# Patient Record
Sex: Male | Born: 1950 | Race: White | Hispanic: No | Marital: Married | Smoking: Never smoker
Health system: Southern US, Community
[De-identification: ages and names within clinical notes are randomized; demographics above are authoritative.]

## PROBLEM LIST (undated history)

## (undated) DIAGNOSIS — E785 Hyperlipidemia, unspecified: Secondary | ICD-10-CM

## (undated) HISTORY — PX: NO PAST SURGERIES: SHX2092

---

## 2015-04-01 DIAGNOSIS — M222X9 Patellofemoral disorders, unspecified knee: Secondary | ICD-10-CM | POA: Insufficient documentation

## 2015-04-01 DIAGNOSIS — M25569 Pain in unspecified knee: Secondary | ICD-10-CM | POA: Insufficient documentation

## 2015-08-23 ENCOUNTER — Ambulatory Visit (INDEPENDENT_AMBULATORY_CARE_PROVIDER_SITE_OTHER): Payer: BLUE CROSS/BLUE SHIELD

## 2015-08-23 ENCOUNTER — Telehealth: Payer: Self-pay | Admitting: *Deleted

## 2015-08-23 ENCOUNTER — Ambulatory Visit (INDEPENDENT_AMBULATORY_CARE_PROVIDER_SITE_OTHER): Payer: BLUE CROSS/BLUE SHIELD | Admitting: Podiatry

## 2015-08-23 ENCOUNTER — Encounter: Payer: Self-pay | Admitting: Podiatry

## 2015-08-23 VITALS — BP 119/77 | HR 89 | Resp 16 | Ht 69.0 in | Wt 205.0 lb

## 2015-08-23 DIAGNOSIS — M7661 Achilles tendinitis, right leg: Secondary | ICD-10-CM | POA: Diagnosis not present

## 2015-08-23 DIAGNOSIS — M79671 Pain in right foot: Secondary | ICD-10-CM

## 2015-08-23 MED ORDER — NITROGLYCERIN 0.2 MG/HR TD PT24: 0.2000 mg | MEDICATED_PATCH | Freq: Every day | TRANSDERMAL | Status: AC

## 2015-08-23 MED ORDER — METHYLPREDNISOLONE 4 MG PO TBPK: ORAL_TABLET | ORAL | Status: DC

## 2015-08-23 NOTE — Patient Instructions (Signed)

## 2015-08-23 NOTE — Progress Notes (Signed)
   Subjective:    Patient ID: Richard Clark, male    DOB: 03/30/1951, 65 y.o.   MRN: [id number]  HPI: He presents today with a chief complaint of pain to the right posterior lateral aspect of his heel. He states that about a year ago he tripped over the age of his patio nearly falling. He states from that day on he's had pain to the posterior aspect of his right heel. He states that some days are worse than others. He is an avid golfer and this is stopping him from enjoying his golf regimen. He is really tried nothing for it.    Review of Systems  Musculoskeletal: Positive for gait problem.  All other systems reviewed and are negative.      Objective:   Physical Exam: I have reviewed his past medical history medications allergy surgery social history review of systems. Pulses are strongly palpable. Neurologic sensorium is intact per Semmes-Weinstein monofilament. Deep tendon reflexes are intact bilateral and muscle strength is normal. He has tenderness on palpation of the Achilles and distal lateral aspect of the right heel. There is no swelling overlying the area. Radiographs do confirm what appears to be a fractured spur with some soft tissue swelling of the Achilles at its insertion site on the right heel. Radiographically other findings include early osteoarthritic changes first metatarsophalangeal joint with dorsal spurring and an elevated first metatarsal.          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  Plan: I started him on a Medrol Dosepak to be followed by meloxicam. Injected the area with dexamethasone and local anesthetic making sure not to inject into the tendon itself. I also recommended that he utilize a night which she does have a period also recommended icing and appropriate shoe gear. Should this not alleviate his symptoms prior to next visit we will consider an MRI to rule out any tear resident.

## 2015-08-23 NOTE — Telephone Encounter (Signed)
Kayla - [facility] states there may be a reaction between pt's Nitroglycerin patch and Viagra.  Dr. Al CorpusHyatt states pt is only on 0.2mg  Nitroglycerin on his right achilles tendon there should be no problem.  I left message with Dr. Geryl RankinsHyatt's [facility] [place] [facility]

## 2015-09-21 ENCOUNTER — Ambulatory Visit (INDEPENDENT_AMBULATORY_CARE_PROVIDER_SITE_OTHER): Payer: BLUE CROSS/BLUE SHIELD | Admitting: Podiatry

## 2015-09-21 ENCOUNTER — Encounter: Payer: Self-pay | Admitting: Podiatry

## 2015-09-21 VITALS — BP 121/79 | HR 91 | Resp 16

## 2015-09-21 DIAGNOSIS — M7661 Achilles tendinitis, right leg: Secondary | ICD-10-CM

## 2015-09-21 DIAGNOSIS — S86011A Strain of right Achilles tendon, initial encounter: Secondary | ICD-10-CM | POA: Diagnosis not present

## 2015-09-21 NOTE — Progress Notes (Signed)
He presents today for follow-up of his Achilles tendinitis. He states that this really is not getting any better as he refers to the distal medial aspect of the Achilles right foot. He states that even with the medication he really didn't get any better and he continues to play golf on a regular basis. He states that it bothers him to walk the golf course and it bothers him after he's been sitting for a while and gets back up to ambulate.  Objective: Vital signs are stable he is alert and oriented 3 conservative therapies Currently failed with pain on palpation to the posterior aspect of the Achilles as it inserts on the right heel. He has no other palpable pain. There is mild erythema and mild edema in this area.  Assessment: Insertional Achilles tendinitis right heel.  Plan: At this point due to failure of all conservative therapies and MRI is going to be requested for the right ankle. I will follow-up with him once that comes in.

## 2015-09-22 ENCOUNTER — Telehealth: Payer: Self-pay | Admitting: *Deleted

## 2015-09-22 NOTE — Telephone Encounter (Addendum)
Orders given to [employer] [facility] for [employer]. 09/23/2015-[employer] PRIOR AUTHORIZATION# [id number], VALID 09/23/2015 - 10/22/2015. [facility].  10/10/2015-Left message informing pt, Dr. Al CorpusHyatt had reviewed the MRI results and was sending for a more in depth reading, there would be a delay, once results were reviewed I would call with instructions.  Pt called for clarification of reason for the over read.  I called pt and explained that in some cases, the doctor was wanting a finer detailed reading of the results or the symptoms presented by the pt and x-rays and the MRI were not as expected.  Pt states understanding and was happy with the explanations and callback. 10/12/2015-Mailed copy of MRI disc to [facility].

## 2015-10-07 ENCOUNTER — Ambulatory Visit
Admission: RE | Admit: 2015-10-07 | Discharge: 2015-10-07 | Disposition: A | Discharge status: Home / Self Care | Payer: BLUE CROSS/BLUE SHIELD | Source: Ambulatory Visit | Attending: Podiatry | Admitting: Podiatry

## 2015-10-07 ENCOUNTER — Telehealth: Payer: Self-pay | Admitting: *Deleted

## 2015-10-07 DIAGNOSIS — S86011A Strain of right Achilles tendon, initial encounter: Secondary | ICD-10-CM | POA: Insufficient documentation

## 2015-10-07 NOTE — Telephone Encounter (Signed)
Patient called - said had MRI of ankle this morning and wanted to know if he had read the results yet.

## 2015-10-10 NOTE — Telephone Encounter (Signed)
-----   Message from Elinor ParkinsonMax T Hyatt, [place] sent at 10/10/2015  7:39 AM EDT ----- Please send for an over read and inform patient of the delay.

## 2015-10-17 ENCOUNTER — Telehealth: Payer: Self-pay | Admitting: *Deleted

## 2015-10-17 NOTE — Telephone Encounter (Signed)
Patient called regarding MRI results. "This is getting ridiculous"  I was just given the results from his over read today by Dr. Al CorpusHyatt and he is requesting for the pt to come in for an appt.

## 2015-10-19 ENCOUNTER — Ambulatory Visit (INDEPENDENT_AMBULATORY_CARE_PROVIDER_SITE_OTHER): Payer: BLUE CROSS/BLUE SHIELD | Admitting: Podiatry

## 2015-10-19 ENCOUNTER — Encounter: Payer: Self-pay | Admitting: Podiatry

## 2015-10-19 VITALS — BP 130/79 | HR 89 | Resp 18

## 2015-10-19 DIAGNOSIS — M7661 Achilles tendinitis, right leg: Secondary | ICD-10-CM | POA: Diagnosis not present

## 2015-10-19 NOTE — Progress Notes (Signed)
Peyton NajjarLarry presents today for follow-up of his Achilles tendinitis of his heel. He states it is not improved at this point.  Objective: MRI does state that there is a retrocalcaneal heel spur that has fragmented and there is tendinopathy associated with the Achilles.  Assessment: No macro tear of the Achilles tendon with tendinitis and tendinopathy and a retrocalcaneal spur.  Plan: At this point were willing to try physical therapy if this does not work then surgery will be necessary.

## 2017-07-02 ENCOUNTER — Ambulatory Visit
Admission: RE | Admit: 2017-07-02 | Discharge: 2017-07-02 | Disposition: A | Discharge status: Home / Self Care | Payer: Medicare Other | Source: Ambulatory Visit | Attending: Gastroenterology | Admitting: Gastroenterology

## 2017-07-02 ENCOUNTER — Ambulatory Visit: Payer: Medicare Other | Admitting: Anesthesiology

## 2017-07-02 ENCOUNTER — Encounter
Admission: RE | Disposition: A | Discharge status: Home / Self Care | Payer: Self-pay | Source: Ambulatory Visit | Attending: Gastroenterology

## 2017-07-02 ENCOUNTER — Encounter: Payer: Self-pay | Admitting: Certified Registered Nurse Anesthetist

## 2017-07-02 DIAGNOSIS — K2210 Ulcer of esophagus without bleeding: Secondary | ICD-10-CM | POA: Diagnosis not present

## 2017-07-02 DIAGNOSIS — K210 Gastro-esophageal reflux disease with esophagitis: Secondary | ICD-10-CM | POA: Diagnosis not present

## 2017-07-02 DIAGNOSIS — Z79899 Other long term (current) drug therapy: Secondary | ICD-10-CM | POA: Insufficient documentation

## 2017-07-02 DIAGNOSIS — E785 Hyperlipidemia, unspecified: Secondary | ICD-10-CM | POA: Diagnosis not present

## 2017-07-02 DIAGNOSIS — K2950 Unspecified chronic gastritis without bleeding: Secondary | ICD-10-CM | POA: Insufficient documentation

## 2017-07-02 DIAGNOSIS — K219 Gastro-esophageal reflux disease without esophagitis: Secondary | ICD-10-CM | POA: Diagnosis present

## 2017-07-02 HISTORY — DX: Hyperlipidemia, unspecified: E78.5

## 2017-07-02 HISTORY — PX: ESOPHAGOGASTRODUODENOSCOPY (EGD) WITH PROPOFOL: SHX5813

## 2017-07-02 SURGERY — ESOPHAGOGASTRODUODENOSCOPY (EGD) WITH PROPOFOL
Anesthesia: General

## 2017-07-02 MED ORDER — LIDOCAINE HCL (PF) 1 % IJ SOLN
INTRAMUSCULAR | Status: AC
  Administered 2017-07-02: 0.3 mL via INTRADERMAL
  Filled 2017-07-02: qty 2

## 2017-07-02 MED ORDER — PROPOFOL 10 MG/ML IV BOLUS
INTRAVENOUS | Status: DC | PRN
  Administered 2017-07-02: 90 mg via INTRAVENOUS
  Administered 2017-07-02: 30 mg via INTRAVENOUS

## 2017-07-02 MED ORDER — LIDOCAINE HCL (PF) 2 % IJ SOLN
INTRAMUSCULAR | Status: AC
  Filled 2017-07-02: qty 10

## 2017-07-02 MED ORDER — LIDOCAINE HCL (CARDIAC) 20 MG/ML IV SOLN
INTRAVENOUS | Status: DC | PRN
  Administered 2017-07-02: 50 mg via INTRAVENOUS

## 2017-07-02 MED ORDER — PROPOFOL 500 MG/50ML IV EMUL
INTRAVENOUS | Status: AC
  Filled 2017-07-02: qty 50

## 2017-07-02 MED ORDER — SODIUM CHLORIDE 0.9 % IV SOLN
INTRAVENOUS | Status: DC
  Administered 2017-07-02: 1000 mL via INTRAVENOUS

## 2017-07-02 MED ORDER — PROPOFOL 500 MG/50ML IV EMUL
INTRAVENOUS | Status: DC | PRN
  Administered 2017-07-02: 140 ug/kg/min via INTRAVENOUS

## 2017-07-02 MED ORDER — LIDOCAINE HCL (PF) 1 % IJ SOLN
2.0000 mL | Freq: Once | INTRAMUSCULAR | Status: AC
  Administered 2017-07-02: 0.3 mL via INTRADERMAL

## 2017-07-02 NOTE — Anesthesia Postprocedure Evaluation (Signed)
Anesthesia Post Note  Patient: Gary FleetLarry Crumpler  Procedure(s) Performed: ESOPHAGOGASTRODUODENOSCOPY (EGD) WITH PROPOFOL (N/A )  Patient location during evaluation: Endoscopy Anesthesia Type: General Level of consciousness: awake and alert and oriented Pain management: pain level controlled Vital Signs Assessment: post-procedure vital signs reviewed and stable Respiratory status: spontaneous breathing, nonlabored ventilation and respiratory function stable Cardiovascular status: blood pressure returned to baseline and stable Postop Assessment: no signs of nausea or vomiting Anesthetic complications: no     Last Vitals:  Vitals:   07/02/17 1100 07/02/17 1120  BP: (!) 134/96 117/88  Pulse: 78 64  Resp: 14 10  Temp:    SpO2: 100% 100%    Last Pain: There were no vitals filed for this visit.               Jaelyn Cloninger

## 2017-07-02 NOTE — Anesthesia Preprocedure Evaluation (Signed)
Anesthesia Evaluation  Patient identified by MRN, date of birth, ID band Patient awake    Reviewed: Allergy & Precautions, NPO status , Patient's Chart, lab work & pertinent test results  History of Anesthesia Complications Negative for: history of anesthetic complications  Airway Mallampati: II  TM Distance: >3 FB Neck ROM: Full    Dental no notable dental hx.    Pulmonary neg pulmonary ROS, neg sleep apnea, neg COPD,    breath sounds clear to auscultation- rhonchi (-) wheezing      Cardiovascular Exercise Tolerance: Good (-) hypertension(-) CAD and (-) Past MI  Rhythm:Regular Rate:Normal - Systolic murmurs and - Diastolic murmurs    Neuro/Psych negative neurological ROS  negative psych ROS   GI/Hepatic negative GI ROS, Neg liver ROS,   Endo/Other  negative endocrine ROSneg diabetes  Renal/GU negative Renal ROS     Musculoskeletal negative musculoskeletal ROS (+)   Abdominal (+) + obese,   Peds  Hematology negative hematology ROS (+)   Anesthesia Other Findings Past Medical History: No date: Hyperlipidemia   Reproductive/Obstetrics                             Anesthesia Physical Anesthesia Plan  ASA: II  Anesthesia Plan: General   Post-op Pain Management:    Induction: Intravenous  PONV Risk Score and Plan: 1 and Propofol infusion  Airway Management Planned: Natural Airway  Additional Equipment:   Intra-op Plan:   Post-operative Plan:   Informed Consent: I have reviewed the patients History and Physical, chart, labs and discussed the procedure including the risks, benefits and alternatives for the proposed anesthesia with the patient or authorized representative who has indicated his/her understanding and acceptance.   Dental advisory given  Plan Discussed with: CRNA and Anesthesiologist  Anesthesia Plan Comments:         Anesthesia Quick Evaluation

## 2017-07-02 NOTE — Transfer of Care (Signed)
Immediate Anesthesia Transfer of Care Note  Patient: Richard FleetLarry Kozicki  Procedure(s) Performed: ESOPHAGOGASTRODUODENOSCOPY (EGD) WITH PROPOFOL (N/A )  Patient Location: PACU and Endoscopy Unit  Anesthesia Type:General  Level of Consciousness: drowsy  Airway & Oxygen Therapy: Patient Spontanous Breathing and Patient connected to nasal cannula oxygen  Post-op Assessment: Report given to RN and Post -op Vital signs reviewed and stable  Post vital signs: Reviewed and stable  Last Vitals:  Vitals:   07/02/17 1000  BP: 118/85  Pulse: 85  Resp: 17  Temp: (!) 36.1 C  SpO2: 100%    Last Pain: There were no vitals filed for this visit.       Complications: No apparent anesthesia complications

## 2017-07-02 NOTE — Op Note (Signed)
[facility] Gastroenterology Patient Name: Gary FleetLarry Erxleben Procedure Date: 07/02/2017 10:28 AM MRN: [id number] Account #: [id number] Date of Birth: 08/27/1950 Admit Type: Outpatient Age: 67 [facility] 3 Gender: Male Note Status: Finalized Procedure:            Upper GI endoscopy Indications:          Gastro-esophageal reflux disease Providers:            Christena DeemMartin U. Skulskie, MD Referring MD:         Lysle Rubensaniel D. Crummett NP, NP (Referring MD) Medicines:            Monitored Anesthesia Care Complications:        No immediate complications. Procedure:            Pre-Anesthesia Assessment:                       - ASA Grade Assessment: II - A patient with mild                        systemic disease.                       After obtaining informed consent, the endoscope was                        passed under direct vision. Throughout the procedure,                        the patient's blood pressure, pulse, and oxygen                        saturations were monitored continuously. The Endoscope                        was introduced through the mouth, and advanced to the                        third part of duodenum. The upper GI endoscopy was                        accomplished without difficulty. The patient tolerated                        the procedure well. Findings:      The Z-line was variable.      LA Grade A (one or more mucosal breaks less than 5 mm, not extending       between tops of 2 mucosal folds) esophagitis with no bleeding was found.       Biopsies were taken with a cold forceps for histology.      The exam of the esophagus was otherwise normal.      Diffuse mild inflammation characterized by erythema was found in the       gastric body. Biopsies were taken with a cold forceps for histology.      The cardia and gastric fundus were normal on retroflexion.      The examined duodenum was normal. Impression:           - Z-line variable.            - LA Grade A erosive esophagitis. Biopsied.                       -  Gastritis. Biopsied.                       - Normal examined duodenum. Recommendation:       - Use Prilosec (omeprazole) 20 mg PO daily daily.                       - Return to GI clinic in 2 months.                       - Await pathology results. Procedure Code(s):    --- Professional ---                       [id number], Esophagogastroduodenoscopy, flexible, transoral;                        with biopsy, single or multiple Diagnosis Code(s):    --- Professional ---                       K22.8, Other specified diseases of esophagus                       K20.8, Other esophagitis                       K29.70, Gastritis, unspecified, without bleeding                       K21.9, Gastro-esophageal reflux disease without                        esophagitis CPT copyright 2016 American Medical Association. All rights reserved. The codes documented in this report are preliminary and upon coder review may  be revised to meet current compliance requirements. Christena Deem, MD 07/02/2017 10:51:14 AM This report has been signed electronically. Number of Addenda: 0 Note Initiated On: 07/02/2017 10:28 AM      [facility]

## 2017-07-02 NOTE — H&P (Signed)
Outpatient short stay form Pre-procedure 07/02/2017 10:19 AM Richard Clark Sayler Mickiewicz MD  Primary Physician: Dr. Royanne Footsaniel Crummett  Reason for visit: EGD  History of present illness: Patient is a 67 year old male presenting today as above.  As personal history of increasing symptoms of reflux.  He is never had a EGD in the past.  He has been placed on proton pump inhibitor to good effect.  Has no difficulty swallowing.  Has minimal heartburn while taking medication.    Current Facility-Administered Medications:  .  0.9 %  sodium chloride infusion, , Intravenous, Continuous, Richard DeemSkulskie, Hawa Henly U, MD .  lidocaine (PF) (XYLOCAINE) 1 % injection 2 mL, 2 mL, Intradermal, Once, Richard DeemSkulskie, [name], MD .  lidocaine (PF) (XYLOCAINE) 1 % injection, , , ,   Medications Prior to Admission  Medication Sig Dispense Refill Last Dose  . albuterol (PROVENTIL HFA;VENTOLIN HFA) 108 (90 Base) MCG/ACT inhaler Inhale 2 puffs into the lungs every 6 (six) hours as needed for wheezing or shortness of breath.     [name]. atorvastatin (LIPITOR) 10 MG tablet   10   . omeprazole (PRILOSEC) 20 MG capsule Take 20 mg by mouth daily.     . nitroGLYCERIN (NITRO-DUR) 0.2 mg/hr patch Place 1 patch (0.2 mg total) onto the skin daily. 30 patch 12   . VIAGRA 100 MG tablet   4      No Known Allergies   Past Medical History:  Diagnosis Date  . Hyperlipidemia     Review of systems:      Physical Exam    Heart and lungs: Regular rate and rhythm without rub or gallop, lungs are bilaterally clear.    HEENT: Normocephalic atraumatic eyes are anicteric    Other:    Pertinant exam for procedure: Soft nontender nondistended bowel sounds positive normoactive.    Planned proceedures: EGD and indicated procedures. I have discussed the risks benefits and complications of procedures to include not limited to bleeding, infection, perforation and the risk of sedation and the patient wishes to proceed.    Richard Clark Caleen Taaffe,  MD Gastroenterology 07/02/2017  10:19 AM

## 2017-07-02 NOTE — Anesthesia Post-op Follow-up Note (Signed)
Anesthesia QCDR form completed.        

## 2017-07-03 ENCOUNTER — Encounter: Payer: Self-pay | Admitting: Gastroenterology

## 2017-07-04 LAB — SURGICAL PATHOLOGY

## 2019-07-12 ENCOUNTER — Ambulatory Visit: Payer: 59

## 2019-12-02 ENCOUNTER — Other Ambulatory Visit: Payer: 59

## 2020-10-07 ENCOUNTER — Other Ambulatory Visit: Payer: Self-pay | Admitting: Internal Medicine

## 2020-10-07 DIAGNOSIS — G8929 Other chronic pain: Secondary | ICD-10-CM

## 2020-10-07 DIAGNOSIS — R1032 Left lower quadrant pain: Secondary | ICD-10-CM

## 2020-10-21 ENCOUNTER — Other Ambulatory Visit: Payer: Self-pay

## 2020-10-21 ENCOUNTER — Ambulatory Visit
Admission: RE | Admit: 2020-10-21 | Discharge: 2020-10-21 | Disposition: A | Discharge status: Home / Self Care | Payer: Medicare Other | Source: Ambulatory Visit | Attending: Internal Medicine | Admitting: Internal Medicine

## 2020-10-21 DIAGNOSIS — G8929 Other chronic pain: Secondary | ICD-10-CM | POA: Diagnosis present

## 2020-10-21 DIAGNOSIS — R1032 Left lower quadrant pain: Secondary | ICD-10-CM | POA: Insufficient documentation

## 2020-10-21 DIAGNOSIS — R109 Unspecified abdominal pain: Secondary | ICD-10-CM | POA: Insufficient documentation

## 2022-10-30 ENCOUNTER — Ambulatory Visit: Payer: Medicare Other

## 2022-10-30 DIAGNOSIS — K640 First degree hemorrhoids: Secondary | ICD-10-CM | POA: Diagnosis not present

## 2022-10-30 DIAGNOSIS — Z1211 Encounter for screening for malignant neoplasm of colon: Secondary | ICD-10-CM | POA: Diagnosis not present

## 2023-02-03 IMAGING — CT CT ABD-PELV W/O CM
2 of 4 series · 16 of 46 positions shown, 18 images · non-contrast
Comparison: None

CLINICAL DATA: LEFT flank pain intermittently for 2 months, history
of ureteral surgery as 19

EXAM:
CT ABDOMEN AND PELVIS WITHOUT CONTRAST
TECHNIQUE: Multidetector CT imaging of the abdomen and pelvis was performed
following the standard protocol without IV contrast. Sagittal and
coronal MPR images reconstructed from axial data set. Patient drank
dilute oral contrast.

[Series 2: axials routine abdomen pelvis without 5.00 · axial · non-contrast · 0.72mm/px · z∈[-1580,-1160]mm · 13 of 94 slices shown, 15 images]
[im 5/94  soft-tissue]
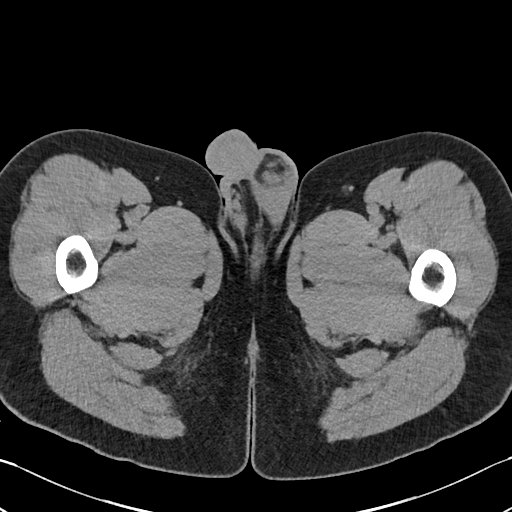
[im 5/94  bone]
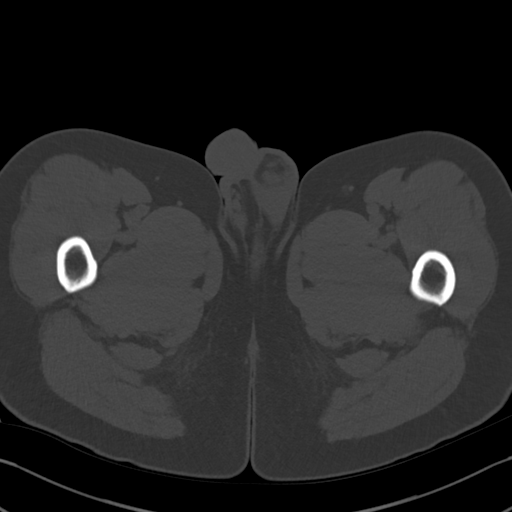
[im 13/94  soft-tissue]
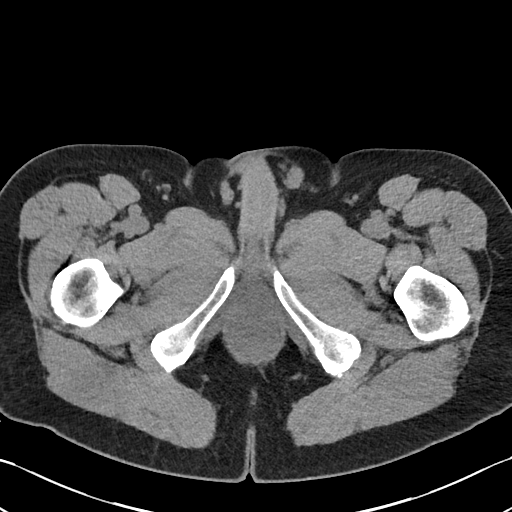
[im 21/94  soft-tissue]
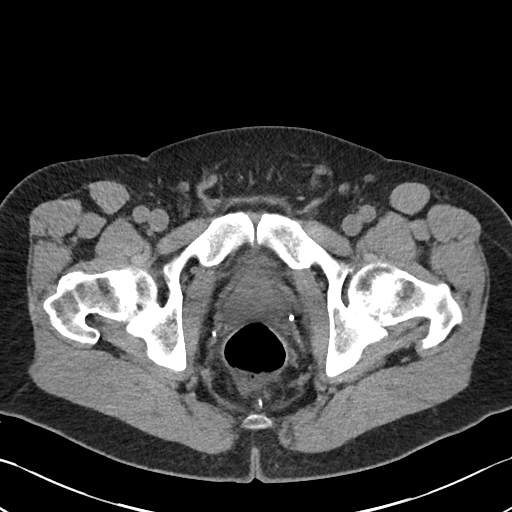
[im 25/94  soft-tissue]
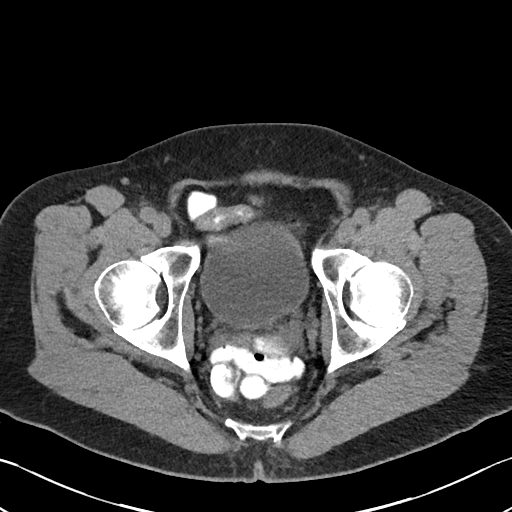
[im 33/94  soft-tissue]
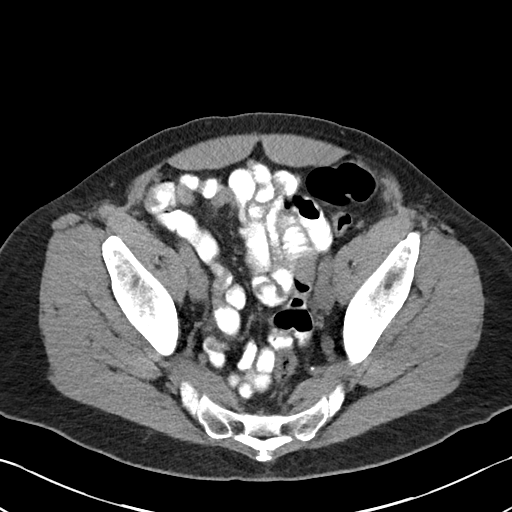
[im 41/94  soft-tissue]
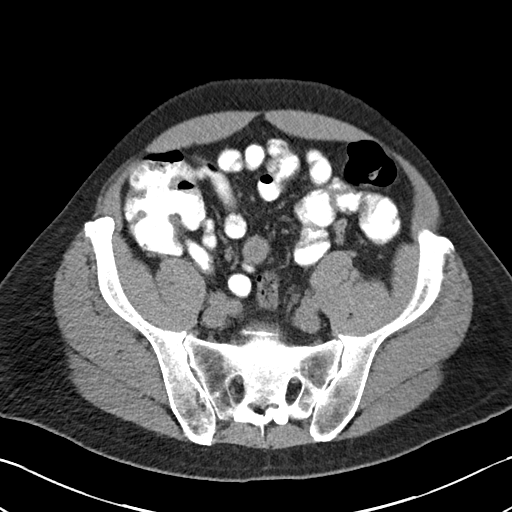
[im 49/94  soft-tissue]
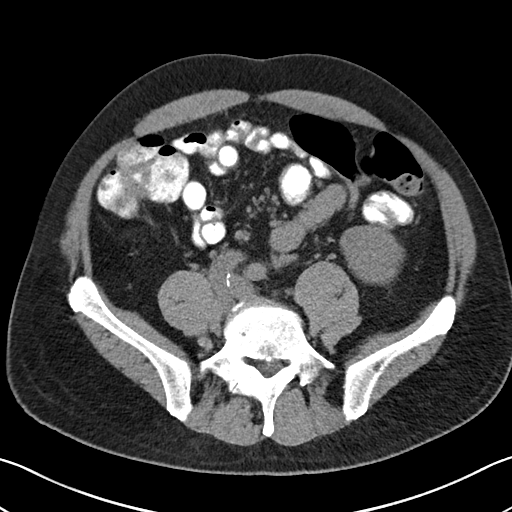
[im 53/94  soft-tissue]
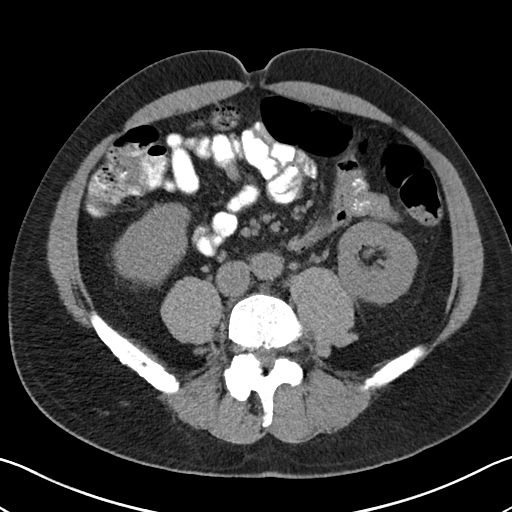
[im 61/94  soft-tissue]
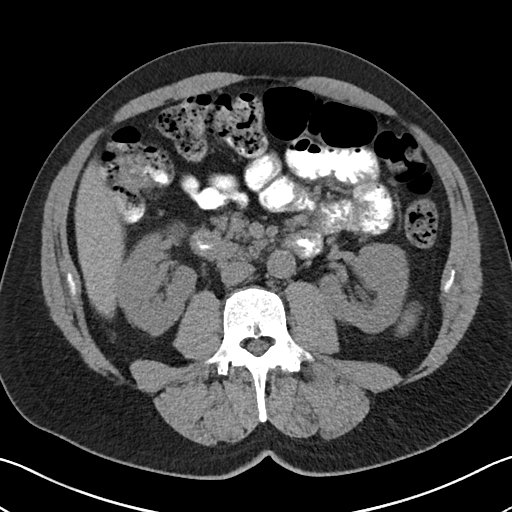
[im 61/94  bone]
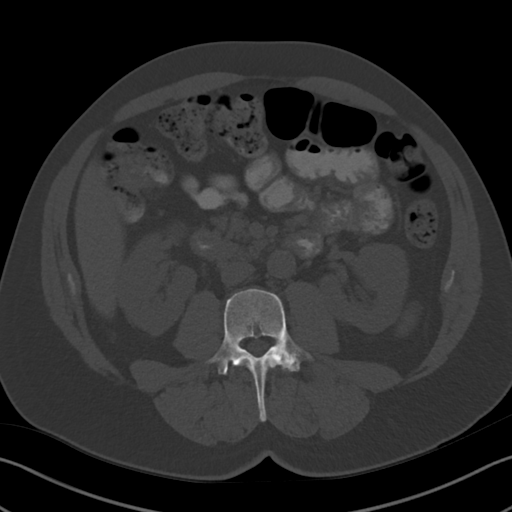
[im 69/94  soft-tissue]
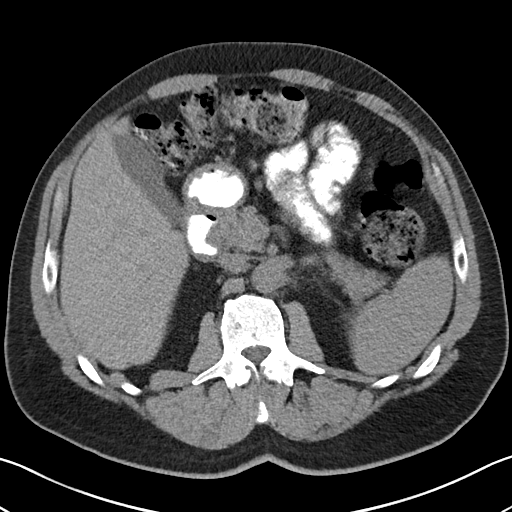
[im 73/94  soft-tissue]
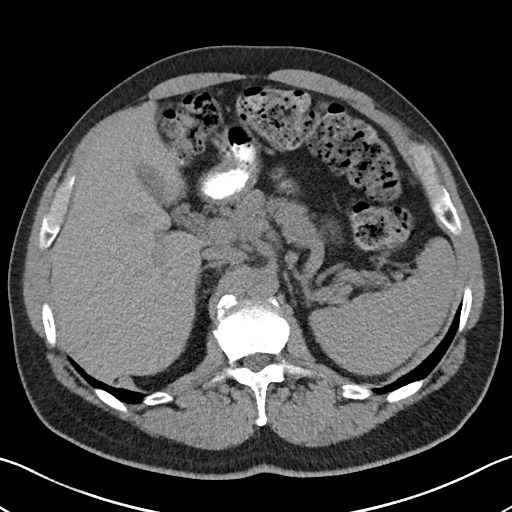
[im 81/94  soft-tissue]
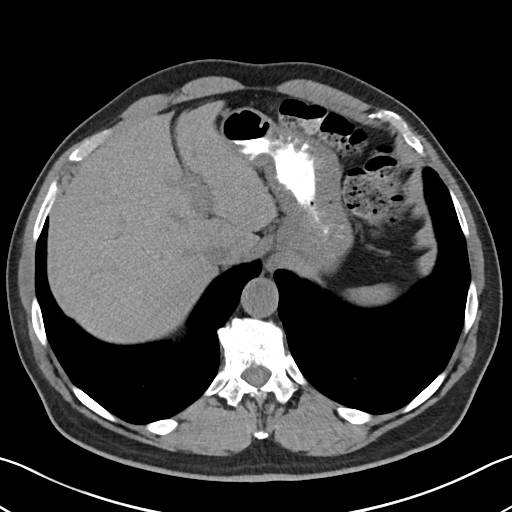
[im 89/94  soft-tissue]
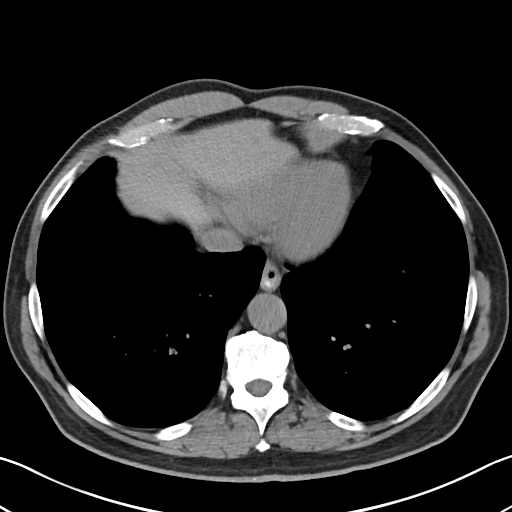

[Series 4: coronals routine abdomen pelvis without 2.00 cor · coronal · non-contrast · 0.72mm/px · 3 of 163 slices shown]
[im 55/163  soft-tissue]
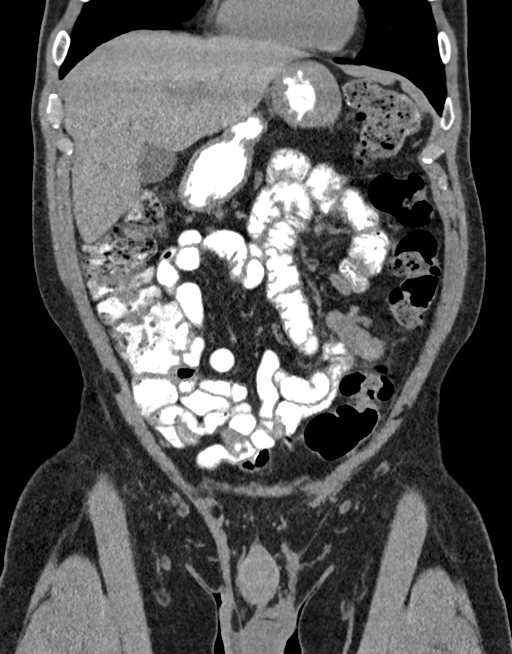
[im 73/163  soft-tissue]
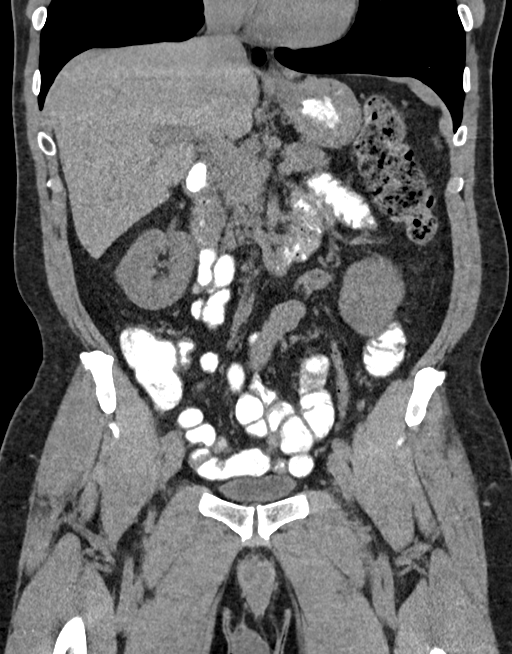
[im 91/163  soft-tissue]
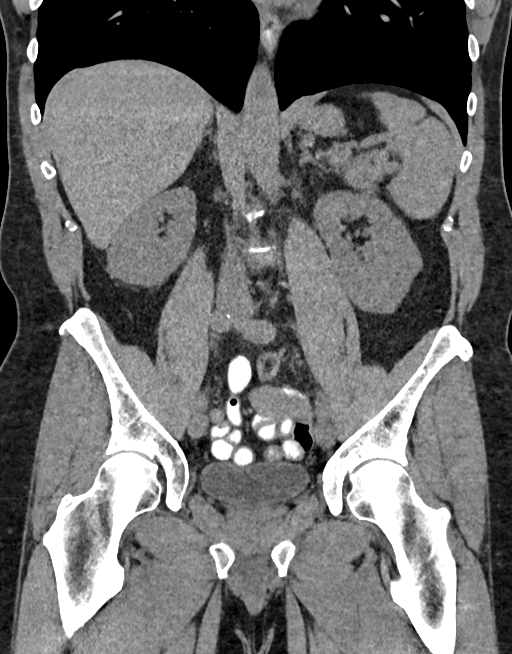

[16 of 46 positions shown; findings below may reference images not displayed]

FINDINGS: Lower chest: Lung bases clear

Hepatobiliary: Gallbladder and liver normal appearance

Pancreas: Normal appearance

Spleen: Normal appearance

Adrenals/Urinary Tract: Adrenal glands, kidneys, ureters, and
bladder normal appearance

Stomach/Bowel: Prominent gastric wall though this may be an artifact
from underdistention, limiting assessment. Normal appendix. Large
and small bowel loops normal.

Vascular/Lymphatic: Aorta normal caliber. Mild atherosclerotic
calcification at LEFT common iliac artery bifurcation. Scattered
pelvic phleboliths. No adenopathy.

Reproductive: Mild prostatic enlargement.

Other: No free air or free fluid. LEFT inguinal hernia containing
fat. Tiny umbilical hernia containing fat. No acute inflammatory
process.

Musculoskeletal: Multifactorial spinal stenosis at L4-L5.
Degenerative disc disease changes lower lumbar spine. No acute bony
findings.
IMPRESSION: Mild prostatic enlargement.

LEFT inguinal and tiny umbilical hernias containing fat.

Multifactorial spinal stenosis at L4-L5.

No acute intra-abdominal or intrapelvic abnormalities.

## 2023-05-13 ENCOUNTER — Other Ambulatory Visit: Payer: Self-pay | Admitting: Internal Medicine

## 2023-05-13 DIAGNOSIS — E782 Mixed hyperlipidemia: Secondary | ICD-10-CM

## 2023-05-13 DIAGNOSIS — R972 Elevated prostate specific antigen [PSA]: Secondary | ICD-10-CM

## 2023-05-13 DIAGNOSIS — Z8249 Family history of ischemic heart disease and other diseases of the circulatory system: Secondary | ICD-10-CM

## 2023-05-16 ENCOUNTER — Ambulatory Visit
Admission: RE | Admit: 2023-05-16 | Discharge: 2023-05-16 | Disposition: A | Discharge status: Home / Self Care | Payer: Medicare Other | Source: Ambulatory Visit | Attending: Internal Medicine | Admitting: Internal Medicine

## 2023-05-16 DIAGNOSIS — R972 Elevated prostate specific antigen [PSA]: Secondary | ICD-10-CM | POA: Insufficient documentation

## 2023-05-16 MED ORDER — GADOBUTROL 1 MMOL/ML IV SOLN
9.0000 mL | Freq: Once | INTRAVENOUS | Status: AC | PRN
  Administered 2023-05-16: 9 mL via INTRAVENOUS

## 2023-05-21 ENCOUNTER — Ambulatory Visit
Admission: RE | Admit: 2023-05-21 | Discharge: 2023-05-21 | Disposition: A | Discharge status: Home / Self Care | Payer: Self-pay | Source: Ambulatory Visit | Attending: Internal Medicine | Admitting: Internal Medicine

## 2023-05-21 DIAGNOSIS — Z8249 Family history of ischemic heart disease and other diseases of the circulatory system: Secondary | ICD-10-CM | POA: Insufficient documentation

## 2023-05-21 DIAGNOSIS — R972 Elevated prostate specific antigen [PSA]: Secondary | ICD-10-CM | POA: Insufficient documentation

## 2023-05-21 DIAGNOSIS — E782 Mixed hyperlipidemia: Secondary | ICD-10-CM | POA: Insufficient documentation
